# Patient Record
Sex: Female | Born: 1970 | Race: Black or African American | Hispanic: No | Marital: Married | Smoking: Never smoker
Health system: Southern US, Community
[De-identification: ages and names within clinical notes are randomized; demographics above are authoritative.]

## PROBLEM LIST (undated history)

## (undated) DIAGNOSIS — I429 Cardiomyopathy, unspecified: Secondary | ICD-10-CM

## (undated) DIAGNOSIS — I10 Essential (primary) hypertension: Secondary | ICD-10-CM

## (undated) DIAGNOSIS — K5010 Crohn's disease of large intestine without complications: Secondary | ICD-10-CM

---

## 2008-04-23 ENCOUNTER — Ambulatory Visit (HOSPITAL_COMMUNITY): Admission: RE | Admit: 2008-04-23 | Discharge: 2008-04-23 | Payer: Self-pay | Admitting: Family Medicine

## 2008-05-14 ENCOUNTER — Ambulatory Visit (HOSPITAL_COMMUNITY): Admission: RE | Admit: 2008-05-14 | Discharge: 2008-05-14 | Payer: Self-pay | Admitting: Specialist

## 2008-11-18 ENCOUNTER — Emergency Department (HOSPITAL_BASED_OUTPATIENT_CLINIC_OR_DEPARTMENT_OTHER): Admission: EM | Admit: 2008-11-18 | Discharge: 2008-11-18 | Payer: Self-pay | Admitting: Emergency Medicine

## 2009-01-13 ENCOUNTER — Emergency Department (HOSPITAL_BASED_OUTPATIENT_CLINIC_OR_DEPARTMENT_OTHER): Admission: EM | Admit: 2009-01-13 | Discharge: 2009-01-13 | Payer: Self-pay | Admitting: Emergency Medicine

## 2009-01-23 ENCOUNTER — Ambulatory Visit (HOSPITAL_COMMUNITY): Admission: RE | Admit: 2009-01-23 | Discharge: 2009-01-23 | Payer: Self-pay | Admitting: Obstetrics & Gynecology

## 2009-01-23 ENCOUNTER — Encounter (INDEPENDENT_AMBULATORY_CARE_PROVIDER_SITE_OTHER): Payer: Self-pay | Admitting: Obstetrics & Gynecology

## 2009-03-28 ENCOUNTER — Emergency Department (HOSPITAL_BASED_OUTPATIENT_CLINIC_OR_DEPARTMENT_OTHER): Admission: EM | Admit: 2009-03-28 | Discharge: 2009-03-28 | Payer: Self-pay | Admitting: Emergency Medicine

## 2009-03-28 ENCOUNTER — Ambulatory Visit: Payer: Self-pay | Admitting: Diagnostic Radiology

## 2009-08-03 ENCOUNTER — Emergency Department (HOSPITAL_BASED_OUTPATIENT_CLINIC_OR_DEPARTMENT_OTHER): Admission: EM | Admit: 2009-08-03 | Discharge: 2009-08-03 | Payer: Self-pay | Admitting: Emergency Medicine

## 2010-07-17 LAB — CBC
HCT: 40.3 % (ref 36.0–46.0)
Hemoglobin: 13.5 g/dL (ref 12.0–15.0)
RBC: 4.9 MIL/uL (ref 3.87–5.11)
WBC: 3.8 10*3/uL — ABNORMAL LOW (ref 4.0–10.5)

## 2010-07-17 LAB — URINALYSIS, ROUTINE W REFLEX MICROSCOPIC
Bilirubin Urine: NEGATIVE
Glucose, UA: NEGATIVE mg/dL
Hgb urine dipstick: NEGATIVE
Specific Gravity, Urine: 1.027 (ref 1.005–1.030)
Urobilinogen, UA: 1 mg/dL (ref 0.0–1.0)
pH: 6.5 (ref 5.0–8.0)

## 2010-07-17 LAB — BASIC METABOLIC PANEL
BUN: 18 mg/dL (ref 6–23)
CO2: 29 mEq/L (ref 19–32)
Chloride: 105 mEq/L (ref 96–112)
GFR calc non Af Amer: >60 mL/min (ref >60)
Glucose, Bld: 84 mg/dL (ref 70–99)
Potassium: 4.1 mEq/L (ref 3.5–5.1)
Sodium: 138 mEq/L (ref 135–145)

## 2011-07-19 ENCOUNTER — Ambulatory Visit (INDEPENDENT_AMBULATORY_CARE_PROVIDER_SITE_OTHER): Payer: 59 | Admitting: Internal Medicine

## 2011-07-19 VITALS — BP 162/93 | HR 71 | Temp 97.9°F | Resp 16 | Ht 68.58 in | Wt 224.8 lb

## 2011-07-19 DIAGNOSIS — J40 Bronchitis, not specified as acute or chronic: Secondary | ICD-10-CM

## 2011-07-19 DIAGNOSIS — I10 Essential (primary) hypertension: Secondary | ICD-10-CM | POA: Insufficient documentation

## 2011-07-19 DIAGNOSIS — K5010 Crohn's disease of large intestine without complications: Secondary | ICD-10-CM | POA: Insufficient documentation

## 2011-07-19 MED ORDER — BENZONATATE 100 MG PO CAPS: 100.0000 mg | ORAL_CAPSULE | Freq: Three times a day (TID) | ORAL | Status: AC | PRN

## 2011-07-19 MED ORDER — AZITHROMYCIN 250 MG PO TABS: ORAL_TABLET | ORAL | Status: AC

## 2011-07-19 NOTE — Patient Instructions (Signed)

## 2011-07-19 NOTE — Progress Notes (Signed)
  Subjective:    Patient ID: Gail Stuart, female    DOB: [birth year], 41 y.o.   MRN: [id number]  HPI [name] is a 41 year old AA who works in [place] and has a PCP there.  She has had a cough since the end of February which is dry and hacky.  She has vomited at times with the coughing after eating.  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  She was seen the end of March in the ED in [place] and treated for a flair of her Crohn's disease with prednisone and just finished this.  She tells me her cough was no better and actually got worse on the prednisone.  No recent fever or chills.  No sinus congestion.  She has not been taking her BP med the past week.    Review of Systems  All other systems reviewed and are negative.  ROS negative except as noted on HPI     Objective:   Physical Exam  Vitals reviewed. Constitutional: She is oriented to person, place, and time. She appears well-developed and well-nourished.  HENT:  Head: Normocephalic and atraumatic.  Right Ear: External ear normal.  Left Ear: External ear normal.  Mouth/Throat: No oropharyngeal exudate.  Eyes: Conjunctivae are normal. Right eye exhibits no discharge. Left eye exhibits no discharge.  Neck: Neck supple. No tracheal deviation present.  Cardiovascular: Normal rate, regular rhythm and normal heart sounds.   Pulmonary/Chest: Effort normal and breath sounds normal. No respiratory distress. She has no wheezes. She has no rales. She exhibits no tenderness.  Lymphadenopathy:    She has no cervical adenopathy.  Neurological: She is alert and oriented to person, place, and time.  Skin: Skin is warm and dry.  Psychiatric: She has a normal mood and affect. Her behavior is normal.          Assessment & Plan:  Cough/Bronchitis:  Zpack and TEssalon Perles given for her symptoms.   Restart BP med, important to take daily, Pt agrees.   AVS printed and given pt.

## 2015-07-21 ENCOUNTER — Emergency Department (HOSPITAL_BASED_OUTPATIENT_CLINIC_OR_DEPARTMENT_OTHER)
Admission: EM | Admit: 2015-07-21 | Discharge: 2015-07-21 | Discharge status: Against Medical Advice | Payer: 59 | Attending: Emergency Medicine | Admitting: Emergency Medicine

## 2015-07-21 ENCOUNTER — Encounter (HOSPITAL_BASED_OUTPATIENT_CLINIC_OR_DEPARTMENT_OTHER): Payer: Self-pay

## 2015-07-21 DIAGNOSIS — I10 Essential (primary) hypertension: Secondary | ICD-10-CM | POA: Insufficient documentation

## 2015-07-21 DIAGNOSIS — B372 Candidiasis of skin and nail: Secondary | ICD-10-CM | POA: Diagnosis not present

## 2015-07-21 DIAGNOSIS — Z79899 Other long term (current) drug therapy: Secondary | ICD-10-CM | POA: Diagnosis not present

## 2015-07-21 DIAGNOSIS — O900 Disruption of cesarean delivery wound: Secondary | ICD-10-CM | POA: Insufficient documentation

## 2015-07-21 DIAGNOSIS — Z7982 Long term (current) use of aspirin: Secondary | ICD-10-CM | POA: Insufficient documentation

## 2015-07-21 DIAGNOSIS — O9883 Other maternal infectious and parasitic diseases complicating the puerperium: Secondary | ICD-10-CM | POA: Insufficient documentation

## 2015-07-21 HISTORY — DX: Essential (primary) hypertension: I10

## 2015-07-21 NOTE — ED Provider Notes (Signed)
CSN: [id number]     Arrival date & time 07/21/15  0117 History   First MD Initiated Contact with Patient 07/21/15 [id number]     Chief Complaint  Patient presents with  . Wound Dehiscence     (Consider location/radiation/quality/duration/timing/severity/associated sxs/prior Treatment) HPI  This is a 45 year old female who states her cesarean section scar was torn open yesterday after lifting a couch. She reports drainage from the site. She is having significant pain at the site. When asked when her cesarean section was done she first answered "3 years ago" then "6 years ago" then "I don't know". She states "there is a hole in it that I can stick my finger into".  Past Medical History  Diagnosis Date  . Hypertension    Past Surgical History  Procedure Laterality Date  . Cesarean section     History reviewed. No pertinent family history. Social History  Substance Use Topics  . Smoking status: Never Smoker   . Smokeless tobacco: None  . Alcohol Use: No   OB History    No data available     Review of Systems    Allergies  Aspirin  Home Medications   Prior to Admission medications   Medication Sig Start Date End Date Taking? Authorizing Provider  losartan (COZAAR) 100 MG tablet Take 100 mg by mouth daily.   Yes Historical Provider, MD  aspirin 81 MG tablet Take 81 mg by mouth daily.    Historical Provider, MD  losartan-hydrochlorothiazide (HYZAAR) 100-25 MG per tablet Take 1 tablet by mouth daily.    Historical Provider, MD   BP 191/119 mmHg  Pulse 88  Temp(Src) 98.6 F (37 C) (Oral)  Resp 16  Ht 5\' 10"  (1.778 m)  Wt 254 lb (115.214 kg)  BMI 36.45 kg/m2  SpO2 98%  LMP 07/14/2015   Physical Exam  General: Well-developed, well-nourished female in no acute distress Skin: Tender intertriginous, erythematous, oozing rash of the abdominal pannus about the patient's previous cesarean section scar; no wound dehiscence or scar separation seen; no bleeding present  ED Course   Procedures (including critical care time)   MDM  The patient became angry when I was lifting her pannus because it was causing her severe pain. She kept insisting that there was a hole that she can stick her finger into but when I attempted to examine it she could not tolerate the pain. She elected to elope before I could complete the history and physical.   Shanon Rosser, MD 07/21/15 [phone]

## 2015-07-21 NOTE — ED Notes (Signed)
Pt left prior to having her b/p rechecked

## 2015-07-21 NOTE — ED Notes (Signed)
Pt reports cesarean wound that opened yesterday after lifting a couch. Drainage noted.

## 2017-03-26 ENCOUNTER — Other Ambulatory Visit: Payer: Self-pay

## 2017-03-26 ENCOUNTER — Encounter (HOSPITAL_BASED_OUTPATIENT_CLINIC_OR_DEPARTMENT_OTHER): Payer: Self-pay | Admitting: Emergency Medicine

## 2017-03-26 ENCOUNTER — Emergency Department (HOSPITAL_BASED_OUTPATIENT_CLINIC_OR_DEPARTMENT_OTHER)
Admission: EM | Admit: 2017-03-26 | Discharge: 2017-03-27 | Disposition: A | Discharge status: Home / Self Care | Payer: 59 | Attending: Emergency Medicine | Admitting: Emergency Medicine

## 2017-03-26 DIAGNOSIS — Z79899 Other long term (current) drug therapy: Secondary | ICD-10-CM | POA: Diagnosis not present

## 2017-03-26 DIAGNOSIS — E876 Hypokalemia: Secondary | ICD-10-CM | POA: Insufficient documentation

## 2017-03-26 DIAGNOSIS — I10 Essential (primary) hypertension: Secondary | ICD-10-CM | POA: Insufficient documentation

## 2017-03-26 DIAGNOSIS — R102 Pelvic and perineal pain: Secondary | ICD-10-CM | POA: Diagnosis not present

## 2017-03-26 DIAGNOSIS — Z7982 Long term (current) use of aspirin: Secondary | ICD-10-CM | POA: Diagnosis not present

## 2017-03-26 DIAGNOSIS — R1032 Left lower quadrant pain: Secondary | ICD-10-CM | POA: Diagnosis present

## 2017-03-26 DIAGNOSIS — D259 Leiomyoma of uterus, unspecified: Secondary | ICD-10-CM | POA: Insufficient documentation

## 2017-03-26 HISTORY — DX: Crohn's disease of large intestine without complications: K50.10

## 2017-03-26 HISTORY — DX: Cardiomyopathy, unspecified: I42.9

## 2017-03-26 LAB — URINALYSIS, ROUTINE W REFLEX MICROSCOPIC
BILIRUBIN URINE: NEGATIVE
Glucose, UA: NEGATIVE mg/dL
HGB URINE DIPSTICK: NEGATIVE
KETONES UR: NEGATIVE mg/dL
Leukocytes, UA: NEGATIVE
NITRITE: NEGATIVE
PROTEIN: NEGATIVE mg/dL
pH: 5.5 (ref 5.0–8.0)

## 2017-03-26 LAB — PREGNANCY, URINE: PREG TEST UR: NEGATIVE

## 2017-03-26 NOTE — ED Triage Notes (Signed)
PT presents with c/o LLQ pain for the past 2 hours.

## 2017-03-27 ENCOUNTER — Other Ambulatory Visit (HOSPITAL_BASED_OUTPATIENT_CLINIC_OR_DEPARTMENT_OTHER): Payer: Self-pay | Admitting: Emergency Medicine

## 2017-03-27 ENCOUNTER — Emergency Department (HOSPITAL_BASED_OUTPATIENT_CLINIC_OR_DEPARTMENT_OTHER): Payer: 59

## 2017-03-27 ENCOUNTER — Encounter (HOSPITAL_BASED_OUTPATIENT_CLINIC_OR_DEPARTMENT_OTHER): Payer: Self-pay | Admitting: Emergency Medicine

## 2017-03-27 ENCOUNTER — Ambulatory Visit (HOSPITAL_BASED_OUTPATIENT_CLINIC_OR_DEPARTMENT_OTHER)
Admit: 2017-03-27 | Discharge: 2017-03-27 | Disposition: A | Discharge status: Home / Self Care | Payer: 59 | Attending: Emergency Medicine | Admitting: Emergency Medicine

## 2017-03-27 ENCOUNTER — Ambulatory Visit (HOSPITAL_BASED_OUTPATIENT_CLINIC_OR_DEPARTMENT_OTHER)
Admission: RE | Admit: 2017-03-27 | Discharge: 2017-03-27 | Disposition: A | Discharge status: Home / Self Care | Payer: 59 | Source: Ambulatory Visit | Attending: Emergency Medicine | Admitting: Emergency Medicine

## 2017-03-27 ENCOUNTER — Encounter (HOSPITAL_BASED_OUTPATIENT_CLINIC_OR_DEPARTMENT_OTHER): Payer: Self-pay

## 2017-03-27 DIAGNOSIS — R102 Pelvic and perineal pain: Secondary | ICD-10-CM | POA: Insufficient documentation

## 2017-03-27 DIAGNOSIS — D259 Leiomyoma of uterus, unspecified: Secondary | ICD-10-CM | POA: Insufficient documentation

## 2017-03-27 LAB — BASIC METABOLIC PANEL
Anion gap: 5 (ref 5–15)
BUN: 12 mg/dL (ref 6–20)
CALCIUM: 8.3 mg/dL — AB (ref 8.9–10.3)
CO2: 26 mmol/L (ref 22–32)
Chloride: 106 mmol/L (ref 101–111)
Creatinine, Ser: 0.71 mg/dL (ref 0.44–1.00)
GFR calc Af Amer: >60 mL/min (ref >60)
GLUCOSE: 109 mg/dL — AB (ref 65–99)
POTASSIUM: 3 mmol/L — AB (ref 3.5–5.1)
SODIUM: 137 mmol/L (ref 135–145)

## 2017-03-27 LAB — CBC WITH DIFFERENTIAL/PLATELET
BASOS ABS: 0 10*3/uL (ref 0.0–0.1)
Basophils Relative: 0 %
EOS ABS: 0.2 10*3/uL (ref 0.0–0.7)
EOS PCT: 6 %
HCT: 31.1 % — ABNORMAL LOW (ref 36.0–46.0)
Hemoglobin: 10.2 g/dL — ABNORMAL LOW (ref 12.0–15.0)
LYMPHS ABS: 1.2 10*3/uL (ref 0.7–4.0)
LYMPHS PCT: 28 %
MCH: 25.4 pg — AB (ref 26.0–34.0)
MCHC: 32.8 g/dL (ref 30.0–36.0)
MCV: 77.4 fL — AB (ref 78.0–100.0)
MONO ABS: 0.6 10*3/uL (ref 0.1–1.0)
Monocytes Relative: 15 %
Neutro Abs: 2.1 10*3/uL (ref 1.7–7.7)
Neutrophils Relative %: 51 %
PLATELETS: 156 10*3/uL (ref 150–400)
RBC: 4.02 MIL/uL (ref 3.87–5.11)
RDW: 16.4 % — AB (ref 11.5–15.5)
WBC: 4.1 10*3/uL (ref 4.0–10.5)

## 2017-03-27 MED ORDER — ONDANSETRON HCL 4 MG/2ML IJ SOLN
4.0000 mg | Freq: Once | INTRAMUSCULAR | Status: AC
  Administered 2017-03-27: 4 mg via INTRAVENOUS
  Filled 2017-03-27: qty 2

## 2017-03-27 MED ORDER — FENTANYL CITRATE (PF) 100 MCG/2ML IJ SOLN
100.0000 ug | Freq: Once | INTRAMUSCULAR | Status: AC
Start: 2017-03-27 — End: 2017-03-27
  Administered 2017-03-27: 100 ug via INTRAVENOUS
  Filled 2017-03-27: qty 2

## 2017-03-27 MED ORDER — SODIUM CHLORIDE 0.9 % IV SOLN
Freq: Once | INTRAVENOUS | Status: AC
  Administered 2017-03-27: 02:00:00 via INTRAVENOUS

## 2017-03-27 MED ORDER — IOPAMIDOL (ISOVUE-300) INJECTION 61%
100.0000 mL | Freq: Once | INTRAVENOUS | Status: AC | PRN
  Administered 2017-03-27: 100 mL via INTRAVENOUS

## 2017-03-27 MED ORDER — HYDROCODONE-ACETAMINOPHEN 5-325 MG PO TABS: 2.0000 | ORAL_TABLET | ORAL | 0 refills | Status: AC | PRN

## 2017-03-27 MED ORDER — POTASSIUM CHLORIDE CRYS ER 20 MEQ PO TBCR
40.0000 meq | EXTENDED_RELEASE_TABLET | Freq: Once | ORAL | Status: AC
  Administered 2017-03-27: 40 meq via ORAL
  Filled 2017-03-27: qty 2

## 2017-03-27 NOTE — ED Notes (Signed)
Pt discharged to home with husband.

## 2017-03-27 NOTE — ED Provider Notes (Addendum)
[facility] Provider Note: Georgena Spurling, MD, FACEP  CSN: [id number] MRN: [id number] ARRIVAL: 03/26/17 at 2311 ROOM: [place]  Abdominal Pain   HISTORY OF PRESENT ILLNESS  03/27/17 1:15 AM Lauran Romanski is a 46 y.o. female who had a relatively rapid onset of left lower quadrant abdominal pain beginning about 2 hours prior to arrival.  She describes the pain is sharp and waxing and waning.  She rates it as a "10 out of 10 or higher".  It is worse with movement or palpation.  She denies fever, chills, nausea, vomiting, diarrhea, vaginal bleeding, vaginal discharge, dysuria or hematuria.  She does have a history of Crohn's disease but states this feels different.  Consultation with the [facility] state controlled substances database reveals the patient has received one opioid prescription in the past year.   Past Medical History:  Diagnosis Date  . Cardiomyopathy ([place])   . Crohn's colitis ([place])   . Hypertension     Past Surgical History:  Procedure Laterality Date  . CESAREAN SECTION      No family history on file.  Social History   Tobacco Use  . Smoking status: Never Smoker  Substance Use Topics  . Alcohol use: No  . Drug use: No    Prior to Admission medications   Medication Sig Start Date End Date Taking? Authorizing Provider  carvedilol (COREG) 25 MG tablet Take 25 mg by mouth 2 (two) times daily with a meal.   Yes [provider]  furosemide (LASIX) 40 MG tablet Take 60 mg by mouth 2 (two) times daily.   Yes [provider]  spironolactone (ALDACTONE) 100 MG tablet Take 100 mg by mouth daily.   Yes [provider]  aspirin 81 MG tablet Take 81 mg by mouth daily.    [provider]  losartan (COZAAR) 100 MG tablet Take 100 mg by mouth daily.    [provider]  losartan-hydrochlorothiazide (HYZAAR) 100-25 MG per tablet Take 1 tablet by mouth daily.    [provider]     Allergies Aspirin   REVIEW OF SYSTEMS  Negative except as noted here or in the History of Present Illness.   PHYSICAL EXAMINATION  Initial Vital Signs Blood pressure (!) 178/96, pulse 99, temperature 98.1 F (36.7 C), temperature source Oral, resp. rate 18, last menstrual period 03/12/2017, SpO2 100 %.  Examination General: Well-developed, well-nourished female in no acute distress; appearance consistent with age of record HENT: normocephalic; atraumatic Eyes: pupils equal, round and reactive to light; extraocular muscles intact Neck: supple Heart: regular rate and rhythm Lungs: clear to auscultation bilaterally Abdomen: soft; nondistended; left lower quadrant tenderness; no masses or hepatosplenomegaly; bowel sounds present Extremities: No deformity; full range of motion; pulses normal Neurologic: Awake, alert and oriented; motor function intact in all extremities and symmetric; no facial droop Skin: Warm and dry Psychiatric: Normal mood and affect   RESULTS  Summary of this visit's results, reviewed by myself:   EKG Interpretation  Date/Time:    Ventricular Rate:    PR Interval:    QRS Duration:   QT Interval:    QTC Calculation:   R Axis:     Text Interpretation:        Laboratory Studies: Results for orders placed or performed during the hospital encounter of 03/26/17 (from the past 24 hour(s))  Pregnancy, urine     Status: None   Collection Time: 03/26/17 11:18 PM  Result Value Ref  Range   Preg Test, Ur NEGATIVE NEGATIVE  Urinalysis, Routine w reflex microscopic     Status: Abnormal   Collection Time: 03/26/17 11:18 PM  Result Value Ref Range   Color, Urine YELLOW YELLOW   APPearance CLOUDY (A) CLEAR   Specific Gravity, Urine >1.030 (H) 1.005 - 1.030   pH 5.5 5.0 - 8.0   Glucose, UA NEGATIVE NEGATIVE mg/dL   Hgb urine dipstick NEGATIVE NEGATIVE   Bilirubin Urine NEGATIVE NEGATIVE   Ketones, ur NEGATIVE NEGATIVE mg/dL   Protein, ur NEGATIVE  NEGATIVE mg/dL   Nitrite NEGATIVE NEGATIVE   Leukocytes, UA NEGATIVE NEGATIVE  CBC with Differential/Platelet     Status: Abnormal   Collection Time: 03/27/17  1:59 AM  Result Value Ref Range   WBC 4.1 4.0 - 10.5 K/uL   RBC 4.02 3.87 - 5.11 MIL/uL   Hemoglobin 10.2 (L) 12.0 - 15.0 g/dL   HCT 31.1 (L) 36.0 - 46.0 %   MCV 77.4 (L) 78.0 - 100.0 fL   MCH 25.4 (L) 26.0 - 34.0 pg   MCHC 32.8 30.0 - 36.0 g/dL   RDW 16.4 (H) 11.5 - 15.5 %   Platelets 156 150 - 400 K/uL   Neutrophils Relative % 51 %   Neutro Abs 2.1 1.7 - 7.7 K/uL   Lymphocytes Relative 28 %   Lymphs Abs 1.2 0.7 - 4.0 K/uL   Monocytes Relative 15 %   Monocytes Absolute 0.6 0.1 - 1.0 K/uL   Eosinophils Relative 6 %   Eosinophils Absolute 0.2 0.0 - 0.7 K/uL   Basophils Relative 0 %   Basophils Absolute 0.0 0.0 - 0.1 K/uL  Basic metabolic panel     Status: Abnormal   Collection Time: 03/27/17  1:59 AM  Result Value Ref Range   Sodium 137 135 - 145 mmol/L   Potassium 3.0 (L) 3.5 - 5.1 mmol/L   Chloride 106 101 - 111 mmol/L   CO2 26 22 - 32 mmol/L   Glucose, Bld 109 (H) 65 - 99 mg/dL   BUN 12 6 - 20 mg/dL   Creatinine, Ser 0.71 0.44 - 1.00 mg/dL   Calcium 8.3 (L) 8.9 - 10.3 mg/dL   GFR calc non Af Amer >60 >60 mL/min   GFR calc Af Amer >60 >60 mL/min   Anion gap 5 5 - 15   Imaging Studies: Ct Abdomen Pelvis W Contrast  Result Date: 03/27/2017 CLINICAL DATA:  Left lower quadrant abdominal pain. EXAM: CT ABDOMEN AND PELVIS WITH CONTRAST TECHNIQUE: Multidetector CT imaging of the abdomen and pelvis was performed using the standard protocol following bolus administration of intravenous contrast. CONTRAST:  147mL ISOVUE-300 IOPAMIDOL (ISOVUE-300) INJECTION 61% COMPARISON:  None. FINDINGS: Lower chest: The included lung bases are clear. Hepatobiliary: No focal liver abnormality is seen. No gallstones, gallbladder wall thickening, or biliary dilatation. Pancreas: No ductal dilatation or inflammation. Spleen: Normal in size  without focal abnormality. Small splenule superiorly. Adrenals/Urinary Tract: No adrenal nodule. No hydronephrosis or perinephric edema. Homogeneous renal enhancement with symmetric excretion on delayed phase imaging. Bilateral lobular renal contours. Urinary bladder is physiologically distended without wall thickening. Stomach/Bowel: Stomach is within normal limits. Appendix appears normal. No evidence of bowel wall thickening, distention, or inflammatory changes. No significant diverticular disease. Vascular/Lymphatic: No significant vascular findings are present. No enlarged abdominal or pelvic lymph nodes. Reproductive: Prominent sized uterus with multiple fibroids, largest involving the left uterus. No adnexal mass. Other: Trace pelvic free fluid. No upper abdominal ascites. No free air or  intra-abdominal abscess. Tiny fat containing umbilical hernia. Musculoskeletal: There are no acute or suspicious osseous abnormalities. Sclerotic focus in the left acetabulum likely a bone island. IMPRESSION: 1. No acute abnormality in the abdomen/pelvis. 2. Fibroid uterus. Electronically Signed   By: Jeb Levering M.D.   On: 03/27/2017 02:38   3:04 AM Patient advised of CT findings.  We will have her return for pelvic ultrasound to further evaluate her uterus and ovaries.  She has a history of fibroids but had these surgically removed so this likely represents a recurrence.  ED COURSE  Nursing notes and initial vitals signs, including pulse oximetry, reviewed.  Vitals:   03/26/17 2318 03/27/17 0128 03/27/17 0251  BP: (!) 178/96 (!) 168/90 (!) 164/91  Pulse: 99 71 71  Resp: 18 16   Temp: 98.1 F (36.7 C)    TempSrc: Oral    SpO2: 100% 100% 94%    PROCEDURES    ED DIAGNOSES     ICD-10-CM   1. Pelvic pain in female R10.2   2. Uterine leiomyoma, unspecified location D25.9   3. Hypokalemia E87.6        Taylin Mans, Jenny Reichmann, MD 03/27/17 1121    Shanon Rosser, MD 03/27/17 0308    Shanon Rosser,  MD 03/27/17 [phone]

## 2017-03-28 LAB — URINE CULTURE: CULTURE: NO GROWTH

## 2018-10-28 IMAGING — US US PELVIS COMPLETE TRANSABD/TRANSVAG
2 series · 13 of 25 positions shown · non-contrast
Comparison: CT scan March 27, 2017

CLINICAL DATA: Left lower quadrant pain since yesterday. History of
fibroids with myomectomy.

EXAM:
TRANSABDOMINAL AND TRANSVAGINAL ULTRASOUND OF PELVIS
TECHNIQUE: Both transabdominal and transvaginal ultrasound examinations of the
pelvis were performed. Transabdominal technique was performed for
global imaging of the pelvis including uterus, ovaries, adnexal
regions, and pelvic cul-de-sac. It was necessary to proceed with
endovaginal exam following the transabdominal exam to visualize the
endometrium and ovaries.

[Series 1: us pelvis complete transabd/transvag · 0.22mm/px · 4 of 16 slices shown (1 of 2)]
[im 1/16]
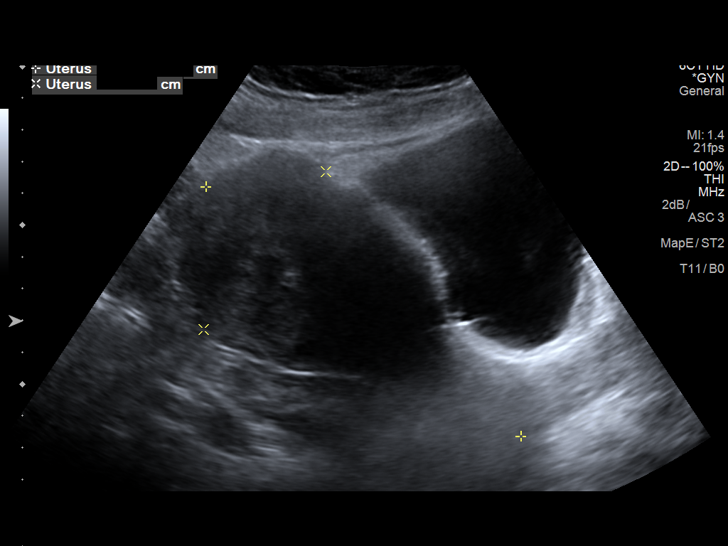
[im 6/16]
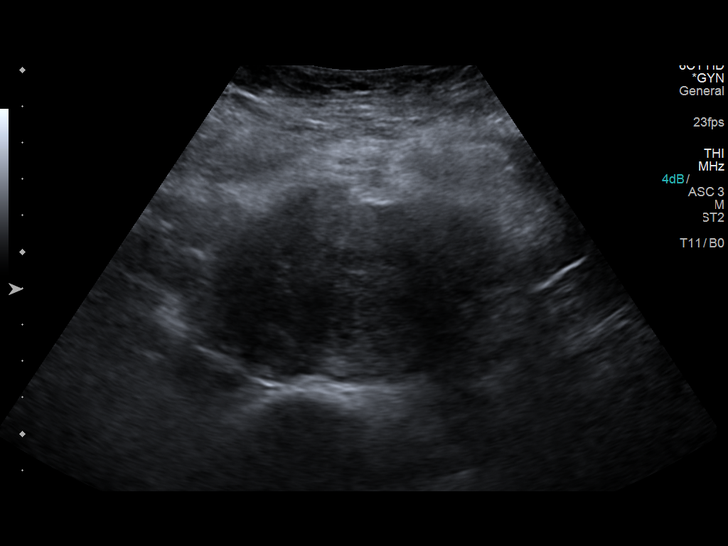
[im 11/16]
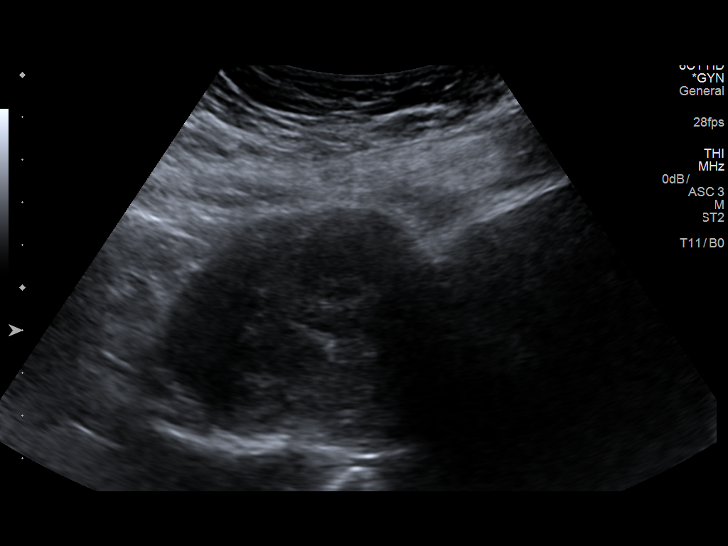
[im 16/16]
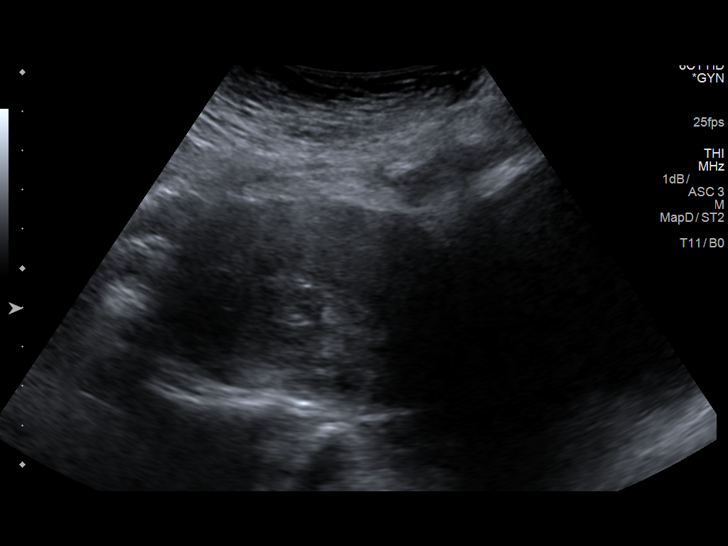

[Series 1001: us pelvis complete transabd/transvag · 0.22mm/px · 9 of 44 slices shown (2 of 2)]
[im 3/44]
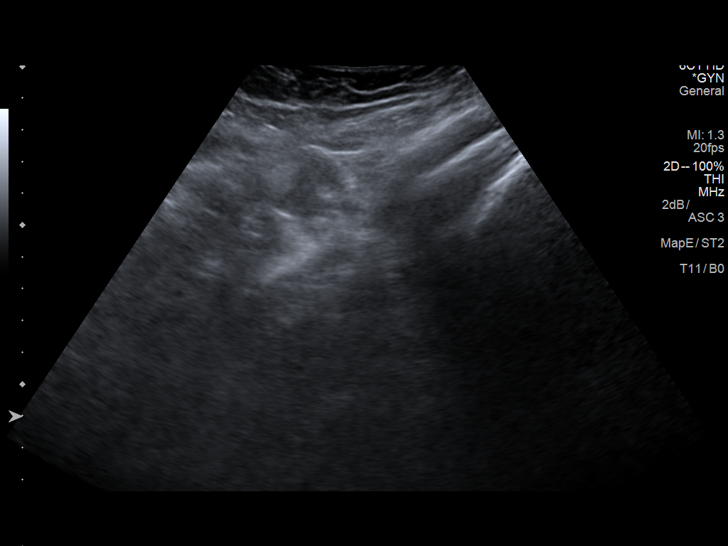
[im 8/44]
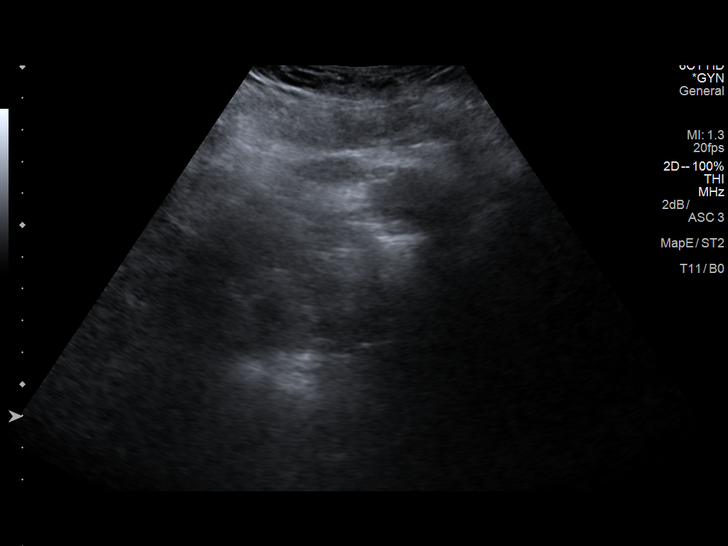
[im 13/44]
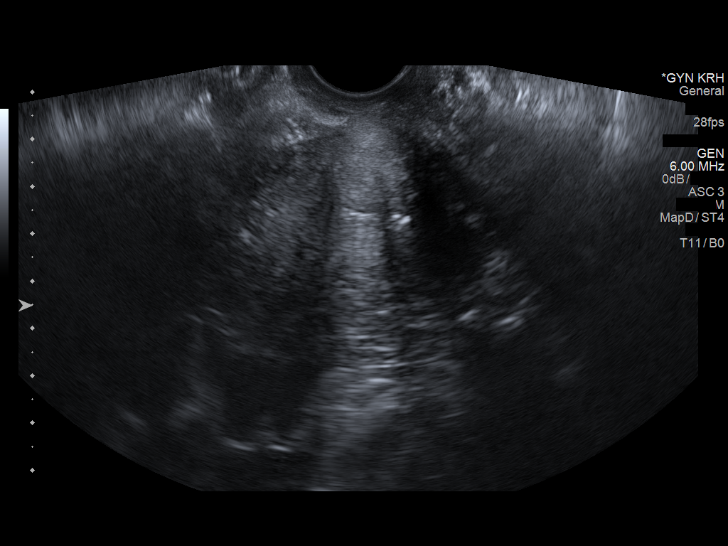
[im 18/44]
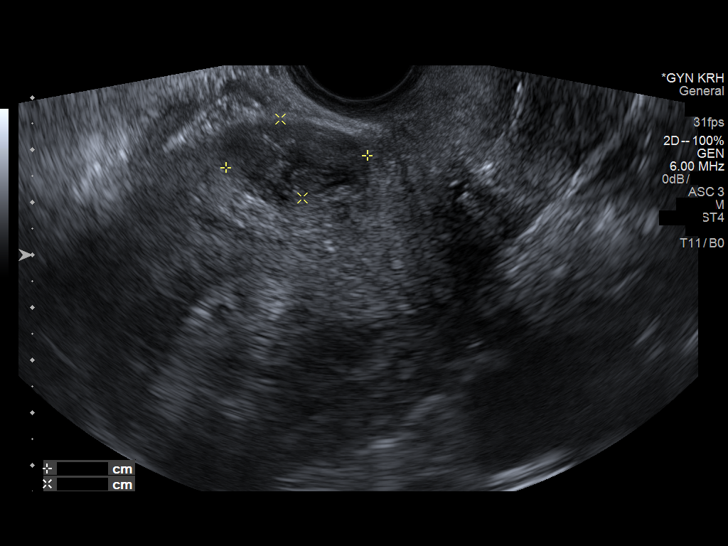
[im 23/44]
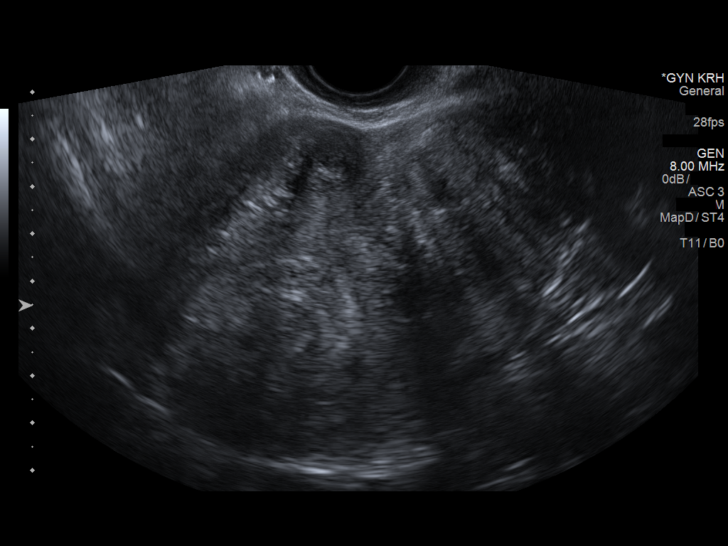
[im 28/44]
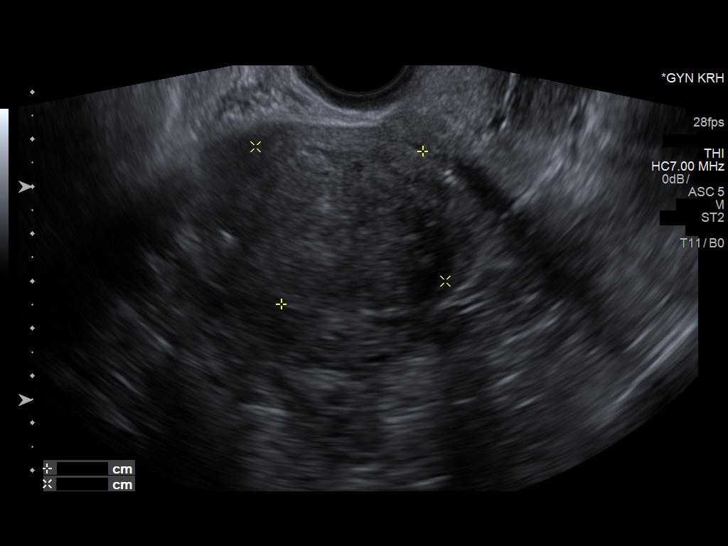
[im 33/44]
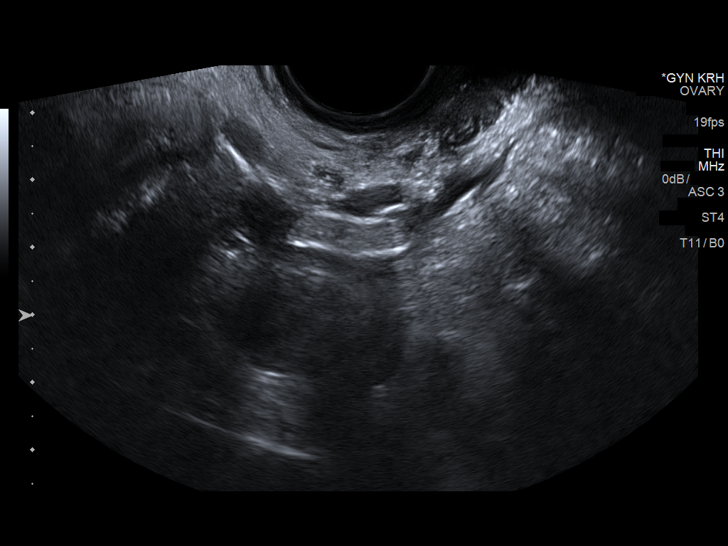
[im 38/44]
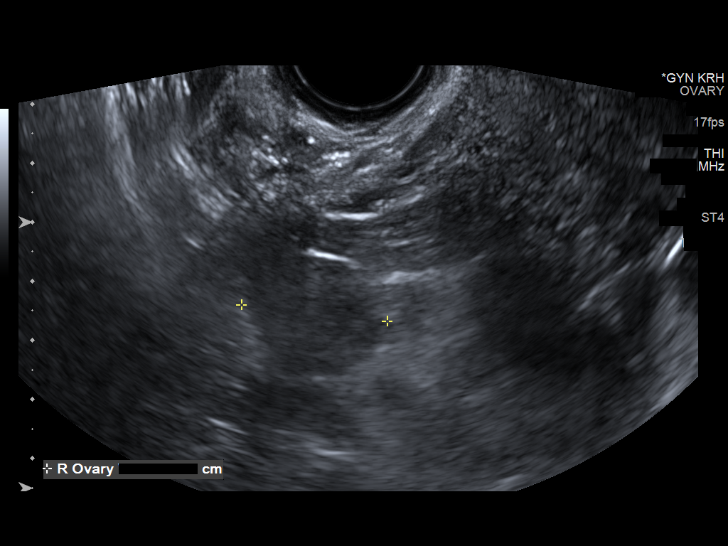
[im 44/44]
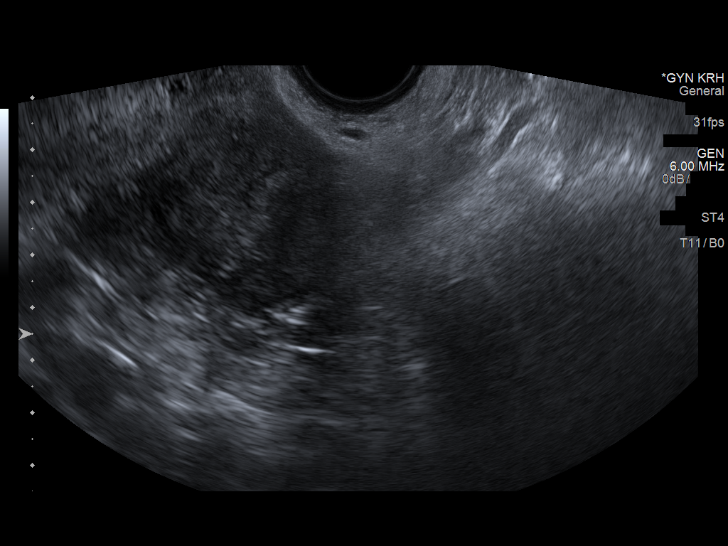

[13 of 25 positions shown; findings below may reference images not displayed]

FINDINGS: Uterus

Measurements: 12.2 x 6.3 x 8.3 cm. Multiple fibroids identified. The
largest is seen in the left side of the fundus measuring 4.4 x 4.9 x
4.6 cm. There is a 16 mm sub endometrial fibroid on the right.

Endometrium

Thickness: 15 mm.  No focal abnormality visualized.

Right ovary

Measurements: 2.7 x 1.7 x 2.5 cm. Normal appearance/no adnexal mass.

Left ovary

Not visualized.

Other findings

No abnormal free fluid.
IMPRESSION: 1. Fibroid uterus. The left ovary was not visualized. The right
ovary is normal.

## 2019-07-11 IMAGING — CT CT ABD-PELV W/ CM
2 of 5 series · 17 of 46 positions shown, 19 images · IV contrast (APPLIED)
Comparison: None.

CLINICAL DATA: Left lower quadrant abdominal pain.

EXAM:
CT ABDOMEN AND PELVIS WITH CONTRAST
TECHNIQUE: Multidetector CT imaging of the abdomen and pelvis was performed
using the standard protocol following bolus administration of
intravenous contrast.
CONTRAST:  100mL [id number]-XLL IOPAMIDOL ([id number]-XLL) INJECTION 61%

[Series 2: axial st · axial · 0.91mm/px · z∈[-580,-155]mm · 14 of 95 slices shown, 16 images]
[im 5/95  soft-tissue]
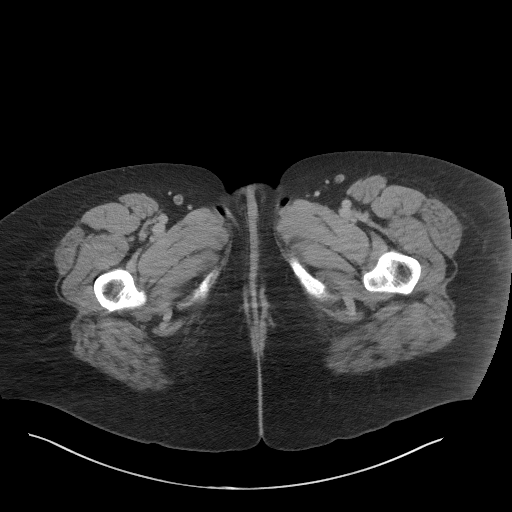
[im 5/95  bone]
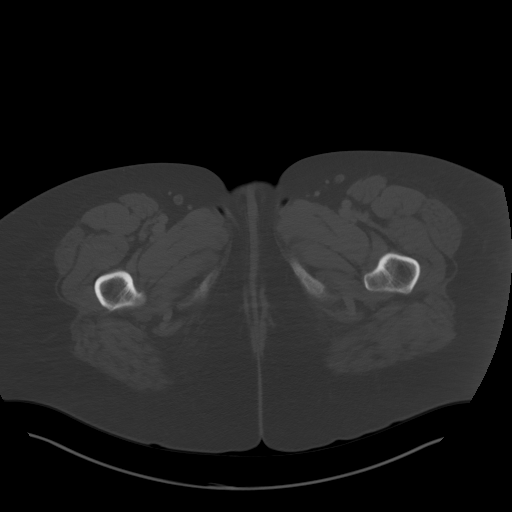
[im 15/95  soft-tissue]
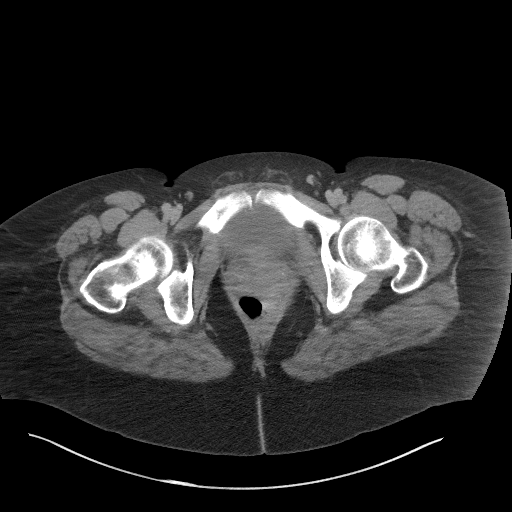
[im 19/95  soft-tissue]
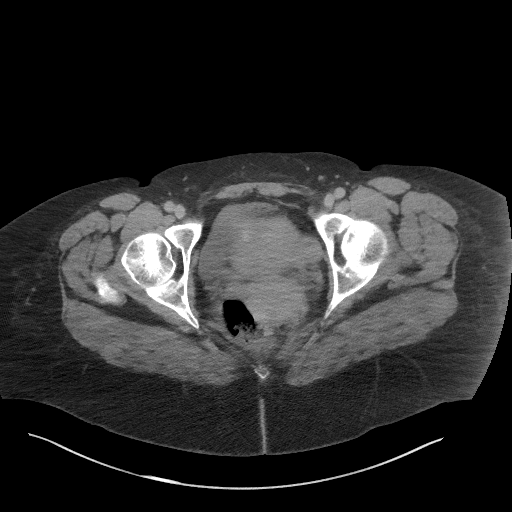
[im 24/95  soft-tissue]
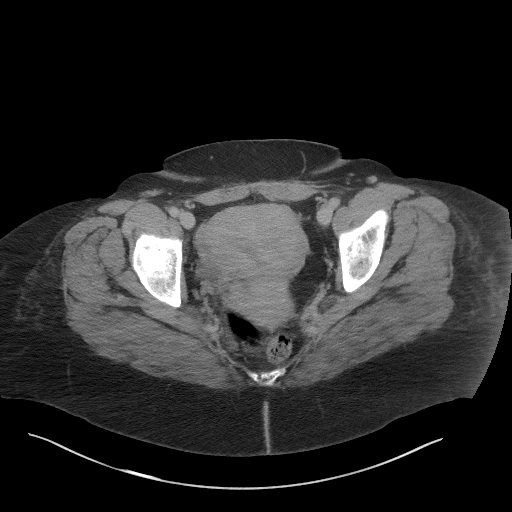
[im 33/95  soft-tissue]
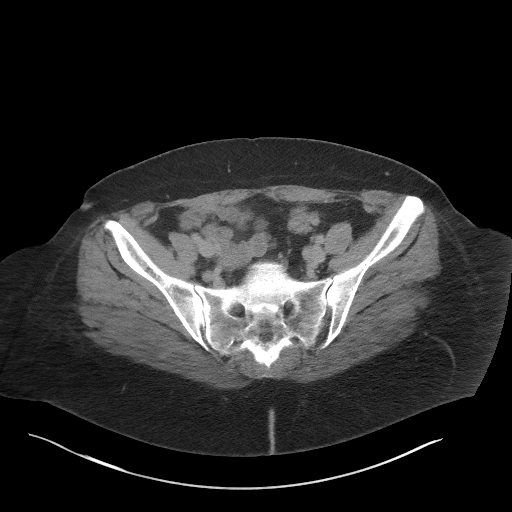
[im 38/95  soft-tissue]
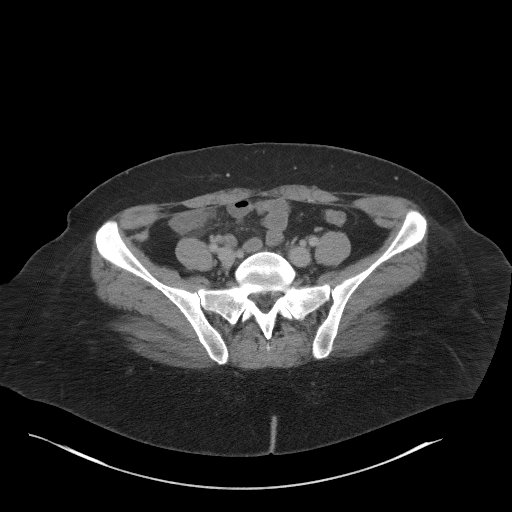
[im 43/95  soft-tissue]
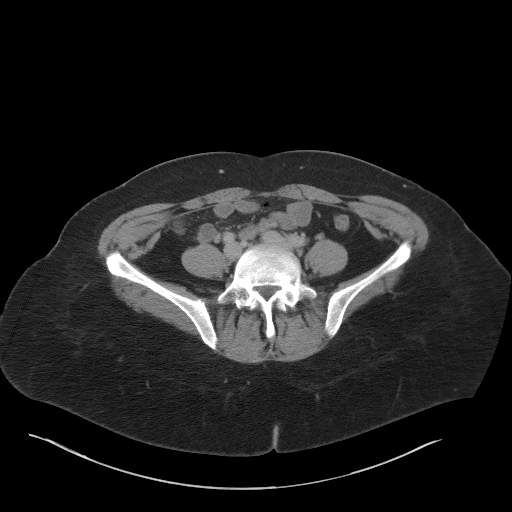
[im 52/95  soft-tissue]
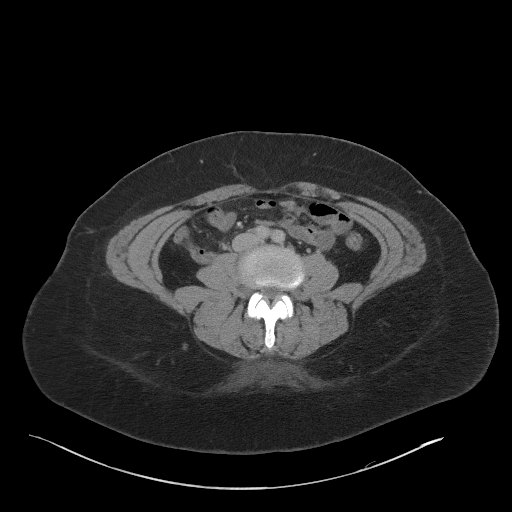
[im 57/95  soft-tissue]
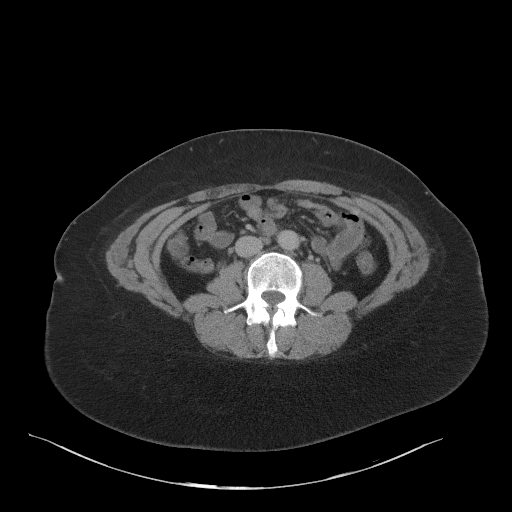
[im 57/95  bone]
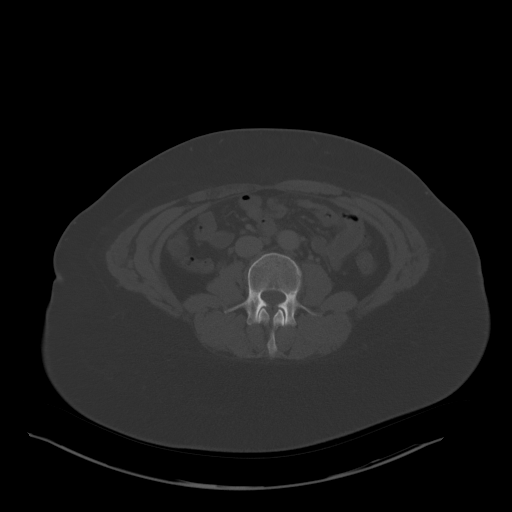
[im 62/95  soft-tissue]
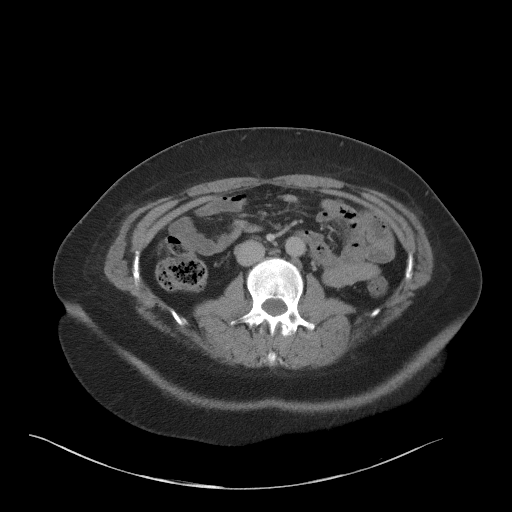
[im 71/95  soft-tissue]
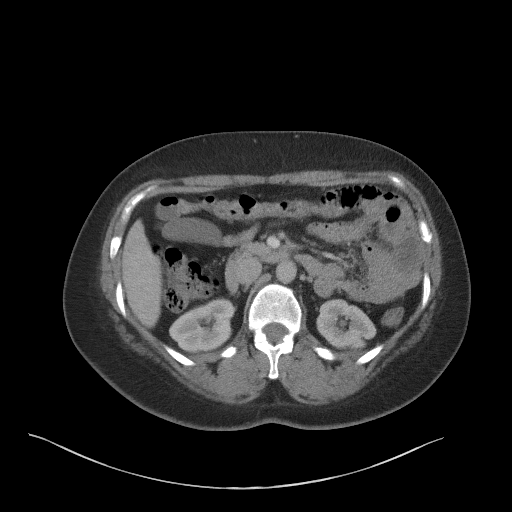
[im 76/95  soft-tissue]
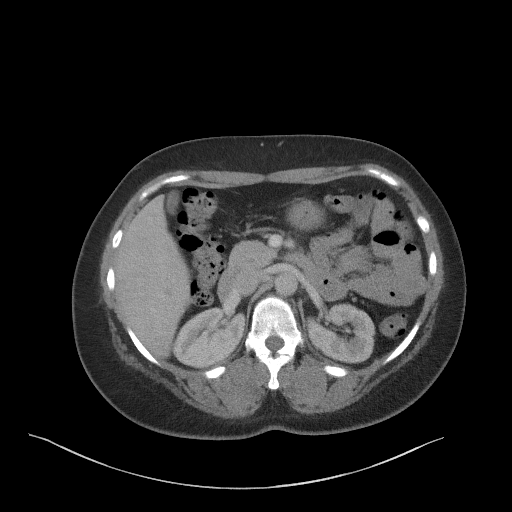
[im 80/95  soft-tissue]
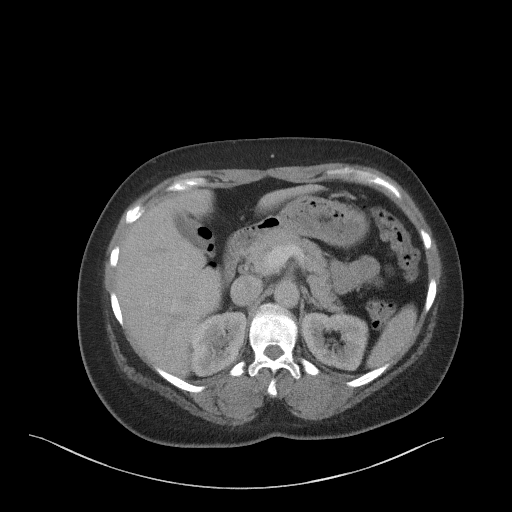
[im 90/95  soft-tissue]
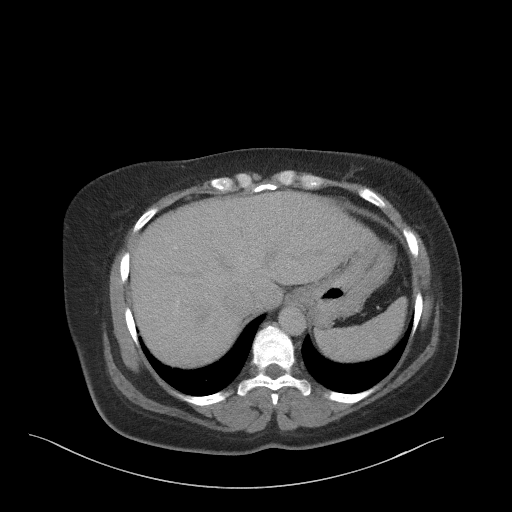

[Series 5: coronal st · coronal · 0.96mm/px · 3 of 95 slices shown]
[im 32/95  soft-tissue]
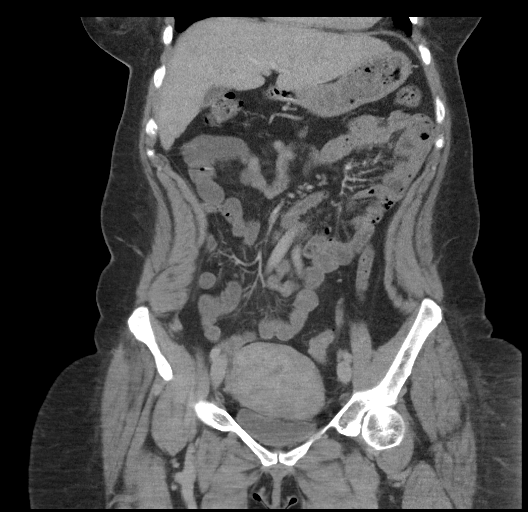
[im 42/95  soft-tissue]
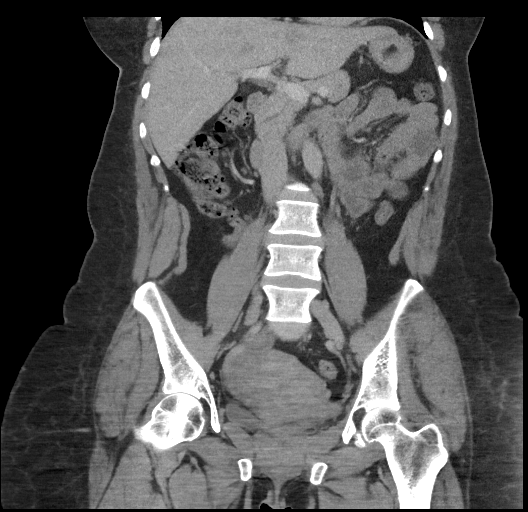
[im 53/95  soft-tissue]
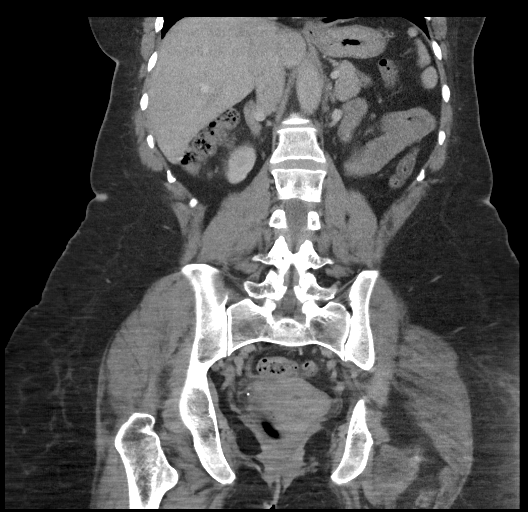

[17 of 46 positions shown; findings below may reference images not displayed]

FINDINGS: Lower chest: The included lung bases are clear.

Hepatobiliary: No focal liver abnormality is seen. No gallstones,
gallbladder wall thickening, or biliary dilatation.

Pancreas: No ductal dilatation or inflammation.

Spleen: Normal in size without focal abnormality. Small splenule
superiorly.

Adrenals/Urinary Tract: No adrenal nodule. No hydronephrosis or
perinephric edema. Homogeneous renal enhancement with symmetric
excretion on delayed phase imaging. Bilateral lobular renal
contours. Urinary bladder is physiologically distended without wall
thickening.

Stomach/Bowel: Stomach is within normal limits. Appendix appears
normal. No evidence of bowel wall thickening, distention, or
inflammatory changes. No significant diverticular disease.

Vascular/Lymphatic: No significant vascular findings are present. No
enlarged abdominal or pelvic lymph nodes.

Reproductive: Prominent sized uterus with multiple fibroids, largest
involving the left uterus. No adnexal mass.

Other: Trace pelvic free fluid. No upper abdominal ascites. No free
air or intra-abdominal abscess. Tiny fat containing umbilical
hernia.

Musculoskeletal: There are no acute or suspicious osseous
abnormalities. Sclerotic focus in the left acetabulum likely a bone
island.
IMPRESSION: 1. No acute abnormality in the abdomen/pelvis.
2. Fibroid uterus.
